# Patient Record
Sex: Female | Born: 1955 | Race: White | Hispanic: No | Marital: Married | Smoking: Never smoker
Health system: Southern US, Community
[De-identification: ages and names within clinical notes are randomized; demographics above are authoritative.]

## PROBLEM LIST (undated history)

## (undated) DIAGNOSIS — M1990 Unspecified osteoarthritis, unspecified site: Secondary | ICD-10-CM

## (undated) HISTORY — PX: OTHER SURGICAL HISTORY: SHX169

## (undated) HISTORY — PX: DILATION AND CURETTAGE, DIAGNOSTIC / THERAPEUTIC: SUR384

---

## 1998-03-16 ENCOUNTER — Other Ambulatory Visit: Admission: RE | Admit: 1998-03-16 | Discharge: 1998-03-16 | Payer: Self-pay | Admitting: Obstetrics and Gynecology

## 1999-05-24 ENCOUNTER — Other Ambulatory Visit: Admission: RE | Admit: 1999-05-24 | Discharge: 1999-05-24 | Payer: Self-pay | Admitting: Obstetrics and Gynecology

## 2000-07-01 ENCOUNTER — Other Ambulatory Visit: Admission: RE | Admit: 2000-07-01 | Discharge: 2000-07-01 | Payer: Self-pay | Admitting: *Deleted

## 2002-06-24 ENCOUNTER — Other Ambulatory Visit: Admission: RE | Admit: 2002-06-24 | Discharge: 2002-06-24 | Payer: Self-pay | Admitting: Obstetrics and Gynecology

## 2005-01-18 ENCOUNTER — Other Ambulatory Visit: Admission: RE | Admit: 2005-01-18 | Discharge: 2005-01-18 | Payer: Self-pay | Admitting: Obstetrics and Gynecology

## 2007-10-20 ENCOUNTER — Encounter: Admission: RE | Admit: 2007-10-20 | Discharge: 2007-10-20 | Payer: Self-pay | Admitting: Obstetrics and Gynecology

## 2017-11-03 ENCOUNTER — Emergency Department (HOSPITAL_COMMUNITY)
Admission: EM | Admit: 2017-11-03 | Discharge: 2017-11-03 | Disposition: A | Discharge status: Home / Self Care | Payer: BC Managed Care – PPO | Attending: Emergency Medicine | Admitting: Emergency Medicine

## 2017-11-03 ENCOUNTER — Emergency Department (HOSPITAL_COMMUNITY): Payer: BC Managed Care – PPO

## 2017-11-03 ENCOUNTER — Encounter (HOSPITAL_COMMUNITY): Payer: Self-pay | Admitting: Emergency Medicine

## 2017-11-03 DIAGNOSIS — Y9389 Activity, other specified: Secondary | ICD-10-CM | POA: Diagnosis not present

## 2017-11-03 DIAGNOSIS — Z79899 Other long term (current) drug therapy: Secondary | ICD-10-CM | POA: Insufficient documentation

## 2017-11-03 DIAGNOSIS — S0181XA Laceration without foreign body of other part of head, initial encounter: Secondary | ICD-10-CM | POA: Diagnosis present

## 2017-11-03 DIAGNOSIS — Y999 Unspecified external cause status: Secondary | ICD-10-CM | POA: Insufficient documentation

## 2017-11-03 DIAGNOSIS — Y92410 Unspecified street and highway as the place of occurrence of the external cause: Secondary | ICD-10-CM | POA: Diagnosis not present

## 2017-11-03 HISTORY — DX: Unspecified osteoarthritis, unspecified site: M19.90

## 2017-11-03 MED ORDER — KETOROLAC TROMETHAMINE 30 MG/ML IJ SOLN
15.0000 mg | Freq: Once | INTRAMUSCULAR | Status: AC
  Administered 2017-11-03: 15 mg via INTRAMUSCULAR
  Filled 2017-11-03: qty 1

## 2017-11-03 MED ORDER — BACITRACIN ZINC 500 UNIT/GM EX OINT
TOPICAL_OINTMENT | Freq: Every day | CUTANEOUS | Status: AC
  Administered 2017-11-03: 09:00:00 via TOPICAL
  Filled 2017-11-03: qty 28.35
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
## 2020-05-09 LAB — COLOGUARD: COLOGUARD: NEGATIVE

## 2021-01-15 ENCOUNTER — Other Ambulatory Visit: Payer: Self-pay | Admitting: Sports Medicine

## 2021-01-15 ENCOUNTER — Ambulatory Visit
Admission: RE | Admit: 2021-01-15 | Discharge: 2021-01-15 | Disposition: A | Discharge status: Home / Self Care | Payer: Self-pay | Source: Ambulatory Visit | Attending: Sports Medicine | Admitting: Sports Medicine

## 2021-01-15 ENCOUNTER — Other Ambulatory Visit: Payer: Self-pay

## 2021-01-15 DIAGNOSIS — R52 Pain, unspecified: Secondary | ICD-10-CM

## 2022-06-28 IMAGING — CR DG LUMBAR SPINE 2-3V
3 series · 3 of 3 positions shown · non-contrast
Comparison: None.

CLINICAL DATA: Left-sided pain.

EXAM:
LUMBAR SPINE - 2-3 VIEW

[w lumbar spine ap]
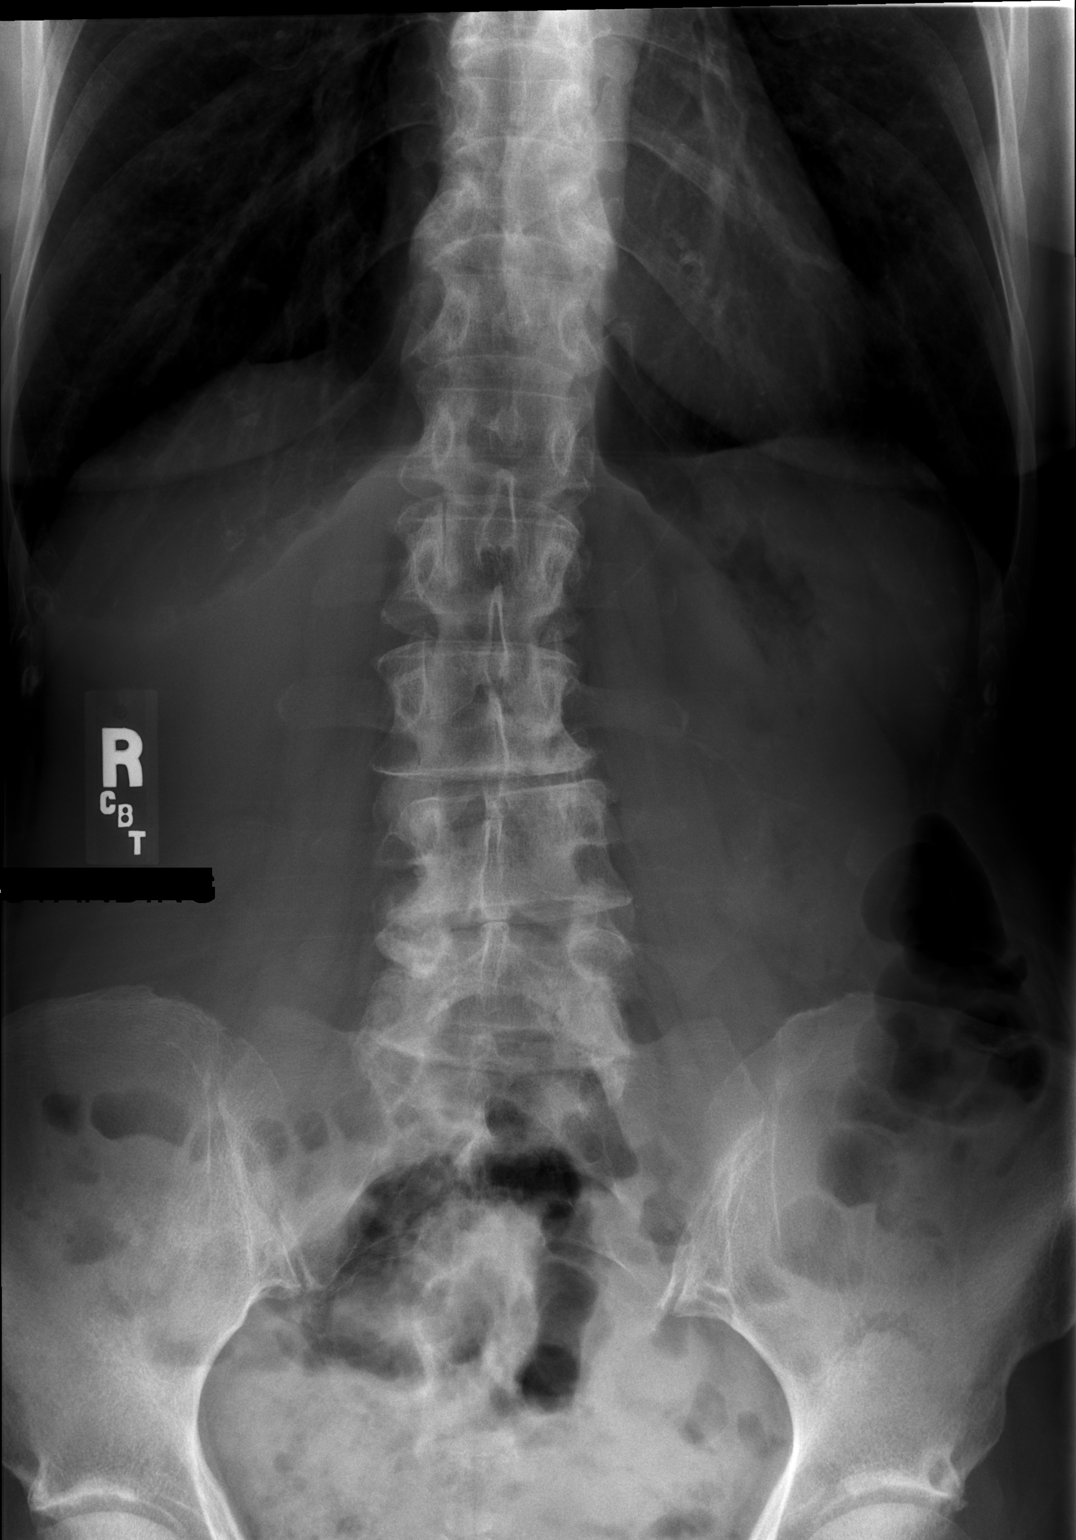

[w lumbar spine lat]
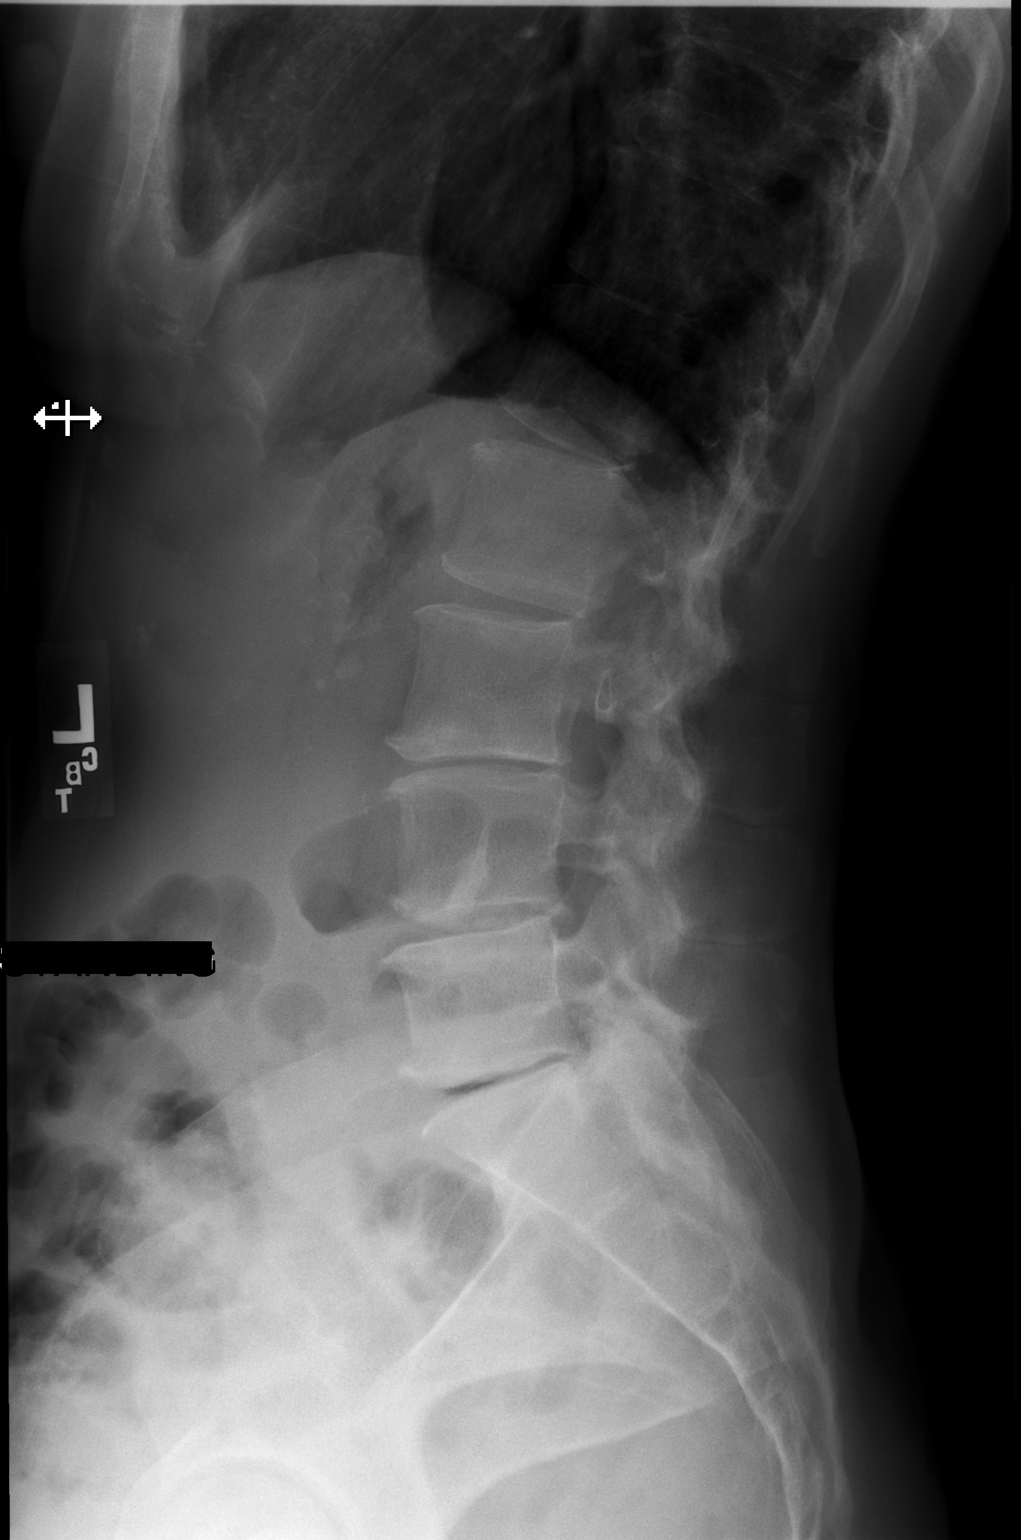

[w lumbar l-5 s-1 spot]
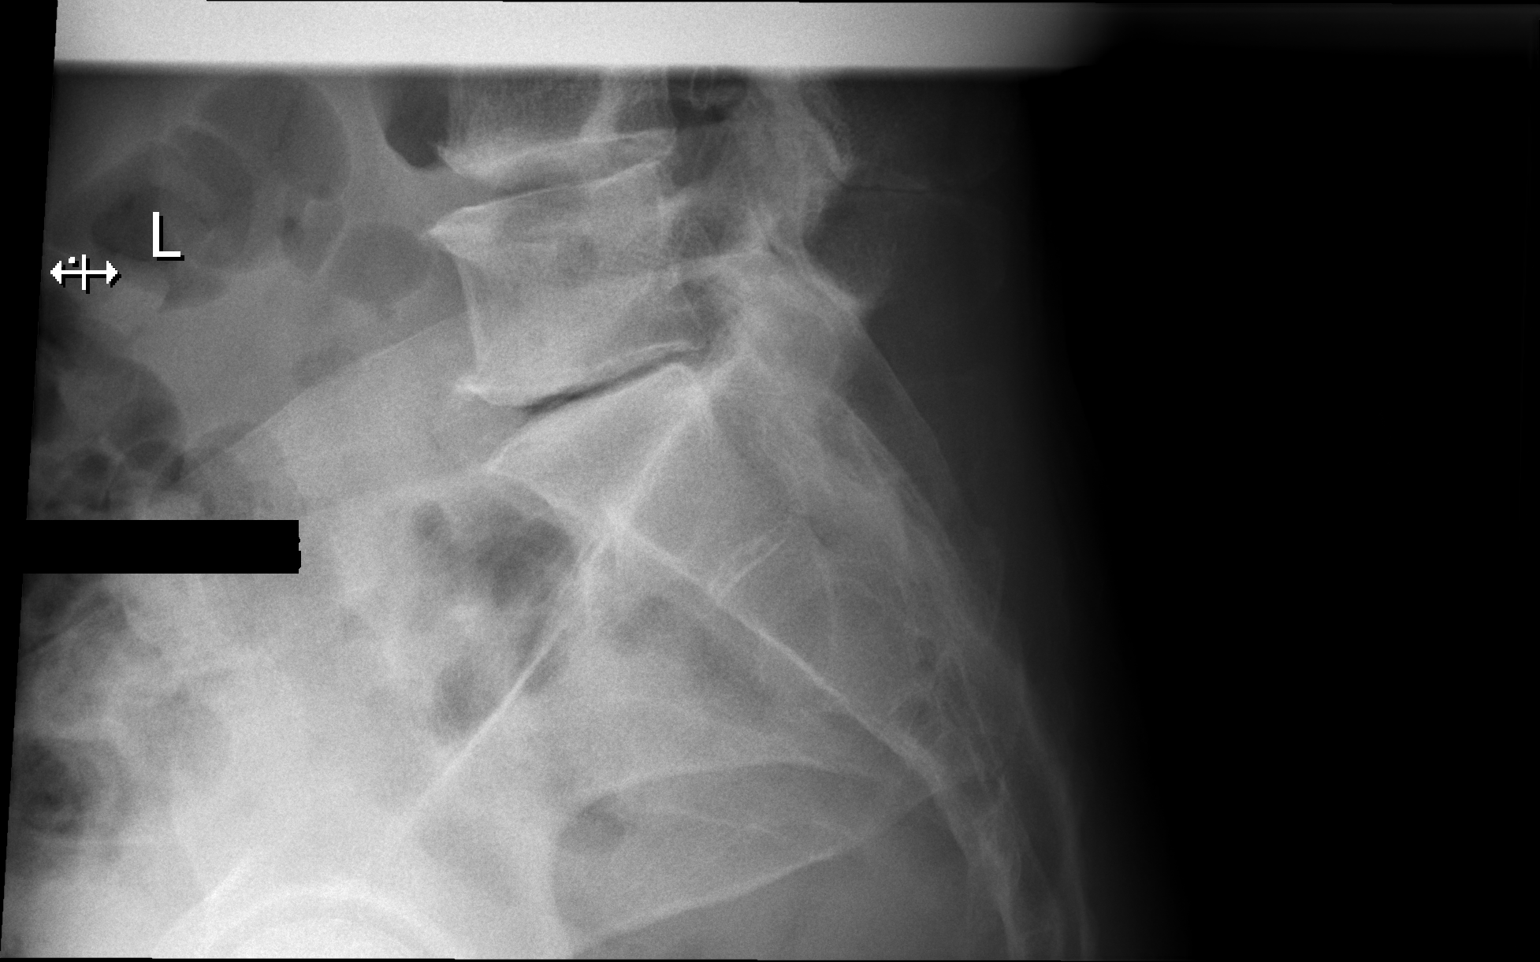

[3 of 3 positions shown; findings below may reference images not displayed]

FINDINGS: Five lumbar type vertebra. There is no acute fracture or subluxation
of the lumbar spine. Multilevel degenerative changes with lower
lumbar disc space narrowing, disc desiccation and pain. The
visualized posterior elements are intact. The soft tissues are
grossly unremarkable.
IMPRESSION: 1. No acute/traumatic lumbar spine pathology.
2. Degenerative changes.

## 2023-03-31 DIAGNOSIS — H00024 Hordeolum internum left upper eyelid: Secondary | ICD-10-CM | POA: Diagnosis not present

## 2023-07-29 DIAGNOSIS — Z1211 Encounter for screening for malignant neoplasm of colon: Secondary | ICD-10-CM | POA: Diagnosis not present

## 2023-07-29 DIAGNOSIS — Z1212 Encounter for screening for malignant neoplasm of rectum: Secondary | ICD-10-CM | POA: Diagnosis not present

## 2023-08-04 LAB — COLOGUARD: COLOGUARD: NEGATIVE

## 2023-09-16 DIAGNOSIS — M545 Low back pain, unspecified: Secondary | ICD-10-CM | POA: Diagnosis not present

## 2023-09-16 DIAGNOSIS — G8929 Other chronic pain: Secondary | ICD-10-CM | POA: Diagnosis not present

## 2023-09-16 DIAGNOSIS — Z6820 Body mass index (BMI) 20.0-20.9, adult: Secondary | ICD-10-CM | POA: Diagnosis not present

## 2023-10-10 DIAGNOSIS — M5451 Vertebrogenic low back pain: Secondary | ICD-10-CM | POA: Diagnosis not present

## 2023-10-13 DIAGNOSIS — M5451 Vertebrogenic low back pain: Secondary | ICD-10-CM | POA: Diagnosis not present

## 2023-10-20 DIAGNOSIS — M5451 Vertebrogenic low back pain: Secondary | ICD-10-CM | POA: Diagnosis not present

## 2023-11-05 DIAGNOSIS — M5451 Vertebrogenic low back pain: Secondary | ICD-10-CM | POA: Diagnosis not present

## 2023-11-18 DIAGNOSIS — Z124 Encounter for screening for malignant neoplasm of cervix: Secondary | ICD-10-CM | POA: Diagnosis not present

## 2023-11-18 DIAGNOSIS — Z6822 Body mass index (BMI) 22.0-22.9, adult: Secondary | ICD-10-CM | POA: Diagnosis not present

## 2023-11-18 DIAGNOSIS — Z13220 Encounter for screening for lipoid disorders: Secondary | ICD-10-CM | POA: Diagnosis not present

## 2023-11-18 DIAGNOSIS — Z1231 Encounter for screening mammogram for malignant neoplasm of breast: Secondary | ICD-10-CM | POA: Diagnosis not present

## 2023-11-18 DIAGNOSIS — Z23 Encounter for immunization: Secondary | ICD-10-CM | POA: Diagnosis not present

## 2023-11-18 DIAGNOSIS — E2839 Other primary ovarian failure: Secondary | ICD-10-CM | POA: Diagnosis not present

## 2023-11-18 DIAGNOSIS — Z0000 Encounter for general adult medical examination without abnormal findings: Secondary | ICD-10-CM | POA: Diagnosis not present

## 2023-11-18 DIAGNOSIS — Z136 Encounter for screening for cardiovascular disorders: Secondary | ICD-10-CM | POA: Diagnosis not present

## 2023-11-18 DIAGNOSIS — Z130 Encounter for screening for diseases of the blood and blood-forming organs and certain disorders involving the immune mechanism: Secondary | ICD-10-CM | POA: Diagnosis not present

## 2023-11-18 DIAGNOSIS — Z131 Encounter for screening for diabetes mellitus: Secondary | ICD-10-CM | POA: Diagnosis not present

## 2023-11-18 DIAGNOSIS — H9193 Unspecified hearing loss, bilateral: Secondary | ICD-10-CM | POA: Diagnosis not present

## 2023-11-20 ENCOUNTER — Other Ambulatory Visit (HOSPITAL_BASED_OUTPATIENT_CLINIC_OR_DEPARTMENT_OTHER): Payer: Self-pay | Admitting: Family Medicine

## 2023-11-20 DIAGNOSIS — E2839 Other primary ovarian failure: Secondary | ICD-10-CM
